# Patient Record
Sex: Male | Born: 1980 | Race: White | Hispanic: No | Marital: Married | Smoking: Never smoker
Health system: Southern US, Community
[De-identification: ages and names within clinical notes are randomized; demographics above are authoritative.]

## PROBLEM LIST (undated history)

## (undated) DIAGNOSIS — J45909 Unspecified asthma, uncomplicated: Secondary | ICD-10-CM

## (undated) HISTORY — PX: CIRCUMCISION: SUR203

---

## 2017-10-28 ENCOUNTER — Other Ambulatory Visit: Payer: Self-pay | Admitting: Family Medicine

## 2017-10-28 DIAGNOSIS — G4484 Primary exertional headache: Secondary | ICD-10-CM

## 2017-11-14 ENCOUNTER — Ambulatory Visit
Admission: RE | Admit: 2017-11-14 | Discharge: 2017-11-14 | Disposition: A | Discharge status: Home / Self Care | Payer: Managed Care, Other (non HMO) | Source: Ambulatory Visit | Attending: Family Medicine | Admitting: Family Medicine

## 2017-11-14 DIAGNOSIS — G4484 Primary exertional headache: Secondary | ICD-10-CM

## 2018-06-29 ENCOUNTER — Other Ambulatory Visit (INDEPENDENT_AMBULATORY_CARE_PROVIDER_SITE_OTHER): Payer: Self-pay | Admitting: Otolaryngology

## 2018-06-29 DIAGNOSIS — J329 Chronic sinusitis, unspecified: Secondary | ICD-10-CM

## 2018-07-06 ENCOUNTER — Ambulatory Visit
Admission: RE | Admit: 2018-07-06 | Discharge: 2018-07-06 | Disposition: A | Discharge status: Home / Self Care | Payer: Managed Care, Other (non HMO) | Source: Ambulatory Visit | Attending: Otolaryngology | Admitting: Otolaryngology

## 2018-07-06 DIAGNOSIS — J329 Chronic sinusitis, unspecified: Secondary | ICD-10-CM

## 2018-07-24 ENCOUNTER — Other Ambulatory Visit: Payer: Self-pay | Admitting: Otolaryngology

## 2018-08-21 ENCOUNTER — Other Ambulatory Visit: Payer: Self-pay

## 2018-08-21 ENCOUNTER — Encounter (HOSPITAL_BASED_OUTPATIENT_CLINIC_OR_DEPARTMENT_OTHER): Payer: Self-pay

## 2018-08-29 ENCOUNTER — Ambulatory Visit (HOSPITAL_BASED_OUTPATIENT_CLINIC_OR_DEPARTMENT_OTHER)
Admission: RE | Admit: 2018-08-29 | Discharge: 2018-08-29 | Disposition: A | Discharge status: Home / Self Care | Payer: Managed Care, Other (non HMO) | Source: Ambulatory Visit | Attending: Otolaryngology | Admitting: Otolaryngology

## 2018-08-29 ENCOUNTER — Encounter (HOSPITAL_BASED_OUTPATIENT_CLINIC_OR_DEPARTMENT_OTHER): Payer: Self-pay

## 2018-08-29 ENCOUNTER — Ambulatory Visit (HOSPITAL_BASED_OUTPATIENT_CLINIC_OR_DEPARTMENT_OTHER): Payer: Managed Care, Other (non HMO) | Admitting: Anesthesiology

## 2018-08-29 ENCOUNTER — Other Ambulatory Visit: Payer: Self-pay

## 2018-08-29 ENCOUNTER — Encounter (HOSPITAL_BASED_OUTPATIENT_CLINIC_OR_DEPARTMENT_OTHER)
Admission: RE | Disposition: A | Discharge status: Home / Self Care | Payer: Self-pay | Source: Ambulatory Visit | Attending: Otolaryngology

## 2018-08-29 DIAGNOSIS — J320 Chronic maxillary sinusitis: Secondary | ICD-10-CM | POA: Insufficient documentation

## 2018-08-29 DIAGNOSIS — J322 Chronic ethmoidal sinusitis: Secondary | ICD-10-CM

## 2018-08-29 DIAGNOSIS — J338 Other polyp of sinus: Secondary | ICD-10-CM

## 2018-08-29 DIAGNOSIS — J45909 Unspecified asthma, uncomplicated: Secondary | ICD-10-CM | POA: Diagnosis not present

## 2018-08-29 DIAGNOSIS — J323 Chronic sphenoidal sinusitis: Secondary | ICD-10-CM

## 2018-08-29 HISTORY — DX: Unspecified asthma, uncomplicated: J45.909

## 2018-08-29 HISTORY — PX: SINUS ENDO W/FUSION: SHX777

## 2018-08-29 HISTORY — PX: ETHMOIDECTOMY: SHX5197

## 2018-08-29 HISTORY — PX: MAXILLARY ANTROSTOMY: SHX2003

## 2018-08-29 SURGERY — SINUS SURGERY, ENDOSCOPIC, USING COMPUTER-ASSISTED NAVIGATION
Anesthesia: General | Site: Nose | Laterality: Bilateral

## 2018-08-29 MED ORDER — MIDAZOLAM HCL 2 MG/2ML IJ SOLN
INTRAMUSCULAR | Status: AC
  Filled 2018-08-29: qty 2

## 2018-08-29 MED ORDER — MIDAZOLAM HCL 2 MG/2ML IJ SOLN
1.0000 mg | INTRAMUSCULAR | Status: DC | PRN
  Administered 2018-08-29: 1 mg via INTRAVENOUS

## 2018-08-29 MED ORDER — PHENYLEPHRINE HCL 10 MG/ML IJ SOLN
INTRAMUSCULAR | Status: DC | PRN
  Administered 2018-08-29: 80 ug via INTRAVENOUS
  Administered 2018-08-29: 120 ug via INTRAVENOUS

## 2018-08-29 MED ORDER — FENTANYL CITRATE (PF) 100 MCG/2ML IJ SOLN
50.0000 ug | INTRAMUSCULAR | Status: DC | PRN
  Administered 2018-08-29 (×2): 50 ug via INTRAVENOUS

## 2018-08-29 MED ORDER — SUGAMMADEX SODIUM 200 MG/2ML IV SOLN
INTRAVENOUS | Status: DC | PRN
  Administered 2018-08-29: 200 mg via INTRAVENOUS

## 2018-08-29 MED ORDER — ROCURONIUM BROMIDE 100 MG/10ML IV SOLN
INTRAVENOUS | Status: DC | PRN
  Administered 2018-08-29: 50 mg via INTRAVENOUS

## 2018-08-29 MED ORDER — LACTATED RINGERS IV SOLN
INTRAVENOUS | Status: DC
  Administered 2018-08-29 (×2): via INTRAVENOUS

## 2018-08-29 MED ORDER — DEXAMETHASONE SODIUM PHOSPHATE 4 MG/ML IJ SOLN
INTRAMUSCULAR | Status: DC | PRN
  Administered 2018-08-29: 10 mg via INTRAVENOUS

## 2018-08-29 MED ORDER — LIDOCAINE 2% (20 MG/ML) 5 ML SYRINGE
INTRAMUSCULAR | Status: AC
  Filled 2018-08-29: qty 5

## 2018-08-29 MED ORDER — CEFAZOLIN SODIUM-DEXTROSE 2-3 GM-%(50ML) IV SOLR
INTRAVENOUS | Status: DC | PRN
  Administered 2018-08-29: 2 g via INTRAVENOUS

## 2018-08-29 MED ORDER — ONDANSETRON HCL 4 MG/2ML IJ SOLN
INTRAMUSCULAR | Status: AC
  Filled 2018-08-29: qty 2

## 2018-08-29 MED ORDER — SUGAMMADEX SODIUM 200 MG/2ML IV SOLN
INTRAVENOUS | Status: AC
  Filled 2018-08-29: qty 2

## 2018-08-29 MED ORDER — FENTANYL CITRATE (PF) 100 MCG/2ML IJ SOLN: 25.0000 ug | INTRAMUSCULAR | Status: DC | PRN

## 2018-08-29 MED ORDER — PROPOFOL 10 MG/ML IV BOLUS
INTRAVENOUS | Status: DC | PRN
  Administered 2018-08-29: 200 mg via INTRAVENOUS

## 2018-08-29 MED ORDER — CLINDAMYCIN HCL 300 MG PO CAPS: 300.0000 mg | ORAL_CAPSULE | Freq: Three times a day (TID) | ORAL | 0 refills | Status: AC

## 2018-08-29 MED ORDER — OXYCODONE HCL 5 MG/5ML PO SOLN: 5.0000 mg | Freq: Once | ORAL | Status: AC | PRN

## 2018-08-29 MED ORDER — DEXAMETHASONE SODIUM PHOSPHATE 10 MG/ML IJ SOLN
INTRAMUSCULAR | Status: AC
  Filled 2018-08-29: qty 1

## 2018-08-29 MED ORDER — ROCURONIUM BROMIDE 50 MG/5ML IV SOSY
PREFILLED_SYRINGE | INTRAVENOUS | Status: AC
  Filled 2018-08-29: qty 5

## 2018-08-29 MED ORDER — PROPOFOL 10 MG/ML IV BOLUS
INTRAVENOUS | Status: AC
  Filled 2018-08-29: qty 20

## 2018-08-29 MED ORDER — OXYCODONE HCL 5 MG PO TABS
5.0000 mg | ORAL_TABLET | Freq: Once | ORAL | Status: AC | PRN
  Administered 2018-08-29: 5 mg via ORAL

## 2018-08-29 MED ORDER — OXYMETAZOLINE HCL 0.05 % NA SOLN
NASAL | Status: DC | PRN
  Administered 2018-08-29: 1 via TOPICAL

## 2018-08-29 MED ORDER — AMOXICILLIN 875 MG PO TABS: 875.0000 mg | ORAL_TABLET | Freq: Two times a day (BID) | ORAL | 0 refills | Status: DC

## 2018-08-29 MED ORDER — OXYCODONE HCL 5 MG PO TABS
ORAL_TABLET | ORAL | Status: AC
  Filled 2018-08-29: qty 1

## 2018-08-29 MED ORDER — SCOPOLAMINE 1 MG/3DAYS TD PT72: 1.0000 | MEDICATED_PATCH | Freq: Once | TRANSDERMAL | Status: DC | PRN

## 2018-08-29 MED ORDER — FENTANYL CITRATE (PF) 100 MCG/2ML IJ SOLN
INTRAMUSCULAR | Status: AC
  Filled 2018-08-29: qty 2

## 2018-08-29 MED ORDER — PROMETHAZINE HCL 25 MG/ML IJ SOLN: 6.2500 mg | INTRAMUSCULAR | Status: DC | PRN

## 2018-08-29 MED ORDER — OXYCODONE-ACETAMINOPHEN 5-325 MG PO TABS: 1.0000 | ORAL_TABLET | ORAL | 0 refills | Status: AC | PRN

## 2018-08-29 MED ORDER — LIDOCAINE HCL (CARDIAC) PF 100 MG/5ML IV SOSY
PREFILLED_SYRINGE | INTRAVENOUS | Status: DC | PRN
  Administered 2018-08-29: 100 mg via INTRAVENOUS

## 2018-08-29 MED ORDER — ONDANSETRON HCL 4 MG/2ML IJ SOLN
INTRAMUSCULAR | Status: DC | PRN
  Administered 2018-08-29: 4 mg via INTRAVENOUS

## 2018-08-29 SURGICAL SUPPLY — 65 items
BLADE RAD40 ROTATE 4M 4 5PK (BLADE) IMPLANT
BLADE RAD40 ROTATE 4M 4MM 5PK (BLADE)
BLADE RAD60 ROTATE M4 4 5PK (BLADE) IMPLANT
BLADE RAD60 ROTATE M4 4MM 5PK (BLADE)
BLADE ROTATE RAD 12 4 M4 (BLADE) IMPLANT
BLADE ROTATE RAD 12 4MM M4 (BLADE)
BLADE ROTATE RAD 40 4 M4 (BLADE) IMPLANT
BLADE ROTATE RAD 40 4MM M4 (BLADE)
BLADE ROTATE TRICUT 4MX13CM M4 (BLADE) ×1
BLADE ROTATE TRICUT 4X13 M4 (BLADE) ×2 IMPLANT
BLADE TRICUT ROTATE M4 4 5PK (BLADE) IMPLANT
BLADE TRICUT ROTATE M4 4MM 5PK (BLADE)
BUR HS RAD FRONTAL 3 (BURR) IMPLANT
BUR HS RAD FRONTAL 3MM (BURR)
CANISTER SUC SOCK COL 7IN (MISCELLANEOUS) ×6 IMPLANT
CANISTER SUCT 1200ML W/VALVE (MISCELLANEOUS) ×3 IMPLANT
COAGULATOR SUCT 6 FR SWTCH (ELECTROSURGICAL)
COAGULATOR SUCT 8FR VV (MISCELLANEOUS) ×3 IMPLANT
COAGULATOR SUCT SWTCH 10FR 6 (ELECTROSURGICAL) IMPLANT
COVER WAND RF STERILE (DRAPES) IMPLANT
DECANTER SPIKE VIAL GLASS SM (MISCELLANEOUS) IMPLANT
DRSG NASAL KENNEDY LMNT 8CM (GAUZE/BANDAGES/DRESSINGS) IMPLANT
DRSG NASOPORE 8CM (GAUZE/BANDAGES/DRESSINGS) IMPLANT
DRSG TELFA 3X8 NADH (GAUZE/BANDAGES/DRESSINGS) IMPLANT
ELECT REM PT RETURN 9FT ADLT (ELECTROSURGICAL) ×3
ELECTRODE REM PT RTRN 9FT ADLT (ELECTROSURGICAL) ×1 IMPLANT
GLOVE BIO SURGEON STRL SZ 6.5 (GLOVE) ×6 IMPLANT
GLOVE BIO SURGEON STRL SZ7.5 (GLOVE) ×3 IMPLANT
GLOVE BIO SURGEONS STRL SZ 6.5 (GLOVE) ×3
GLOVE BIOGEL PI IND STRL 7.0 (GLOVE) ×3 IMPLANT
GLOVE BIOGEL PI INDICATOR 7.0 (GLOVE) ×6
GOWN STRL REUS W/ TWL LRG LVL3 (GOWN DISPOSABLE) ×4 IMPLANT
GOWN STRL REUS W/TWL LRG LVL3 (GOWN DISPOSABLE) ×8
HEMOSTAT SURGICEL 2X14 (HEMOSTASIS) IMPLANT
IV NS 1000ML (IV SOLUTION)
IV NS 1000ML BAXH (IV SOLUTION) IMPLANT
IV NS 500ML (IV SOLUTION) ×2
IV NS 500ML BAXH (IV SOLUTION) ×1 IMPLANT
NEEDLE HYPO 25X1 1.5 SAFETY (NEEDLE) ×3 IMPLANT
NEEDLE PRECISIONGLIDE 27X1.5 (NEEDLE) IMPLANT
NEEDLE SPNL 25GX3.5 QUINCKE BL (NEEDLE) IMPLANT
NS IRRIG 1000ML POUR BTL (IV SOLUTION) ×3 IMPLANT
PACK BASIN DAY SURGERY FS (CUSTOM PROCEDURE TRAY) ×3 IMPLANT
PACK ENT DAY SURGERY (CUSTOM PROCEDURE TRAY) ×3 IMPLANT
PACKING NASAL EPIS 4X2.4 XEROG (MISCELLANEOUS) IMPLANT
SLEEVE SCD COMPRESS KNEE MED (MISCELLANEOUS) ×3 IMPLANT
SOLUTION BUTLER CLEAR DIP (MISCELLANEOUS) ×3 IMPLANT
SPLINT NASAL AIRWAY SILICONE (MISCELLANEOUS) IMPLANT
SPONGE GAUZE 2X2 8PLY STER LF (GAUZE/BANDAGES/DRESSINGS) ×1
SPONGE GAUZE 2X2 8PLY STRL LF (GAUZE/BANDAGES/DRESSINGS) ×2 IMPLANT
SPONGE NEURO XRAY DETECT 1X3 (DISPOSABLE) ×3 IMPLANT
SUCTION FRAZIER HANDLE 10FR (MISCELLANEOUS) ×2
SUCTION TUBE FRAZIER 10FR DISP (MISCELLANEOUS) ×1 IMPLANT
SUT ETHILON 3 0 PS 1 (SUTURE) IMPLANT
SUT PLAIN 4 0 ~~LOC~~ 1 (SUTURE) IMPLANT
SYR 3ML 23GX1 SAFETY (SYRINGE) IMPLANT
SYR 50ML LL SCALE MARK (SYRINGE) ×3 IMPLANT
TOWEL GREEN STERILE FF (TOWEL DISPOSABLE) ×3 IMPLANT
TRACKER ENT INSTRUMENT (MISCELLANEOUS) ×3 IMPLANT
TRACKER ENT PATIENT (MISCELLANEOUS) ×3 IMPLANT
TUBE CONNECTING 20'X1/4 (TUBING) ×1
TUBE CONNECTING 20X1/4 (TUBING) ×2 IMPLANT
TUBE SALEM SUMP 16 FR W/ARV (TUBING) ×3 IMPLANT
TUBING STRAIGHTSHOT EPS 5PK (TUBING) ×3 IMPLANT
YANKAUER SUCT BULB TIP NO VENT (SUCTIONS) ×3 IMPLANT

## 2018-08-29 NOTE — H&P (Signed)
Cc: Chronic rhinosinusitis, headache  HPI: The patient is a 37 year old male who returns today for his follow-up evaluation. The patient was last seen 4 weeks ago.  At that time, he was noted to have bilateral chronic maxillary sinusitis, with possible fungal infection in the right maxillary sinus.  He was also complaining of frequent recurrent headaches. He subsequently underwent a sinus CT scan. The CT scan showed hyperdense material within the right maxillary sinus, concerning for fungal sinusitis.  He also has mucosal thickening in the left maxillary sinus, and ethmoid and sphenoid sinuses.  The mucosal thickening is worse within the right sphenoid and left ethmoid sinuses.  The patient returns today again complaining of frequent recurrent headaches.  His headache is often exacerbated with exercise.  Currently he denies any fever or visual change. No other ENT, GI, or respiratory issue noted since the last visit.   Exam: General: Communicates without difficulty, well nourished, no acute distress. Head: Normocephalic, no evidence injury, no tenderness, facial buttresses intact without stepoff. Face/sinus: No tenderness to palpation and percussion. Facial movement is normal and symmetric. Eyes: PERRL, EOMI. No scleral icterus, conjunctivae clear. Neuro: CN II exam reveals vision grossly intact.  No nystagmus at any point of gaze. Ears: Auricles well formed without lesions.  Ear canals are intact without mass or lesion.  No erythema or edema is appreciated.  The TMs are intact without fluid. Nose: External evaluation reveals normal support and skin without lesions.  Dorsum is intact.  Anterior rhinoscopy reveals congested mucosa over anterior aspect of inferior turbinates and deviated septum. Oral:  Oral cavity and oropharynx are intact, symmetric, without erythema or edema.  Mucosa is moist without lesions. Neck: Full range of motion without pain.  There is no significant lymphadenopathy.  No masses  palpable.  Thyroid bed within normal limits to palpation.  Parotid glands and submandibular glands equal bilaterally without mass.  Trachea is midline. Neuro:  CN 2-12 grossly intact. Gait normal.   Assessment 1.  Bilateral chronic rhinosinusitis, with possible fungal infection within the right maxillary sinus. Both ostiomeatal complexes are narrowed by his mucosal edema.  Mucosal thickening in the ethmoid and sphenoid sinuses.   2.  Frequent recurrent headaches.   Plan  1.  The physical exam findings and the CT images are extensively reviewed with the patient.  2. The treatment options are also extensively discussed.  Based on the above findings, it is recommended the patient should undergo endoscopic sinus surgery to improve his sinonasal drainage pathways.  Specifically, the hyperdense material within his right maxillary sinus should be removed.  The drainage pathways of his maxillary, ethmoid and sphenoid sinuses should be opened.  The risks, benefits, and details of the procedures are reviewed with the patient.  3.  The patient would like to proceed with the procedures.

## 2018-08-29 NOTE — Transfer of Care (Signed)
Immediate Anesthesia Transfer of Care Note  Patient: Bobby Fisher  Procedure(s) Performed: ENDOSCOPIC SINUS SURGERY WITH FUSION NAVIGATION (Bilateral Nose) BILATERAL MAXILLARY ANTROSTOMY WITH TISSUE REMOVAL (Bilateral Nose) TOTAL ETHMOIDECTOMY AND SPENOIDECTOMY WITH TISSUE REMOVAL (Bilateral Nose)  Patient Location: PACU  Anesthesia Type:General  Level of Consciousness: awake, alert  and oriented  Airway & Oxygen Therapy: Patient Spontanous Breathing and Patient connected to face mask oxygen  Post-op Assessment: Report given to RN and Post -op Vital signs reviewed and stable  Post vital signs: Reviewed and stable  Last Vitals:  Vitals Value Taken Time  BP 124/77 08/29/2018 11:06 AM  Temp    Pulse 84 08/29/2018 11:07 AM  Resp 16 08/29/2018 11:07 AM  SpO2 100 % 08/29/2018 11:07 AM  Vitals shown include unvalidated device data.  Last Pain:  Vitals:   08/29/18 0841  TempSrc: Oral  PainSc: 0-No pain         Complications: No apparent anesthesia complications

## 2018-08-29 NOTE — Op Note (Signed)
DATE OF PROCEDURE: 08/29/2018  OPERATIVE REPORT   SURGEON: Newman PiesSu Kirk Basquez, MD   PREOPERATIVE DIAGNOSES:  1. Bilateral chronic rhinosinusitis and polyposis (maxillary, ethmoid, and sphenoid) 2. Chronic headache.  POSTOPERATIVE DIAGNOSES:  1. Bilateral chronic rhinosinusitis and polyposis (maxillary, ethmoid, and sphenoid) 2. Chronic headache.  PROCEDURE PERFORMED:  1. Bilateral endoscopic total ethmoidectomy and sphenoidotomy with polyp removal. 2. Bilateral endoscopic maxillary antrostomy with polyp removal. 3. FUSION stereotactic image guidance.  ANESTHESIA: General endotracheal tube anesthesia.   COMPLICATIONS: None.   ESTIMATED BLOOD LOSS: 200 mL.   INDICATION FOR PROCEDURE: Bobby Fisher is a 37 y.o. male with a history of chronic headache and rhinosinusitis. His CT scan showed hyperdense material within the right maxillary sinus, concerning for fungal sinusitis.  He also has mucosal thickening in the left maxillary sinus, and ethmoid and sphenoid sinuses.  The mucosal thickening is worse within the right sphenoid and left ethmoid sinuses.  Based on the above findings, the decision was made for the patient to undergo the above-stated procedures. The risks, benefits, alternatives, and details of the procedures were discussed with the patient. Questions were invited and answered. Informed consent was obtained.   DESCRIPTION OF PROCEDURE: The patient was taken to the operating room and placed supine on the operating table. General endotracheal tube anesthesia was administered by the anesthesiologist. The patient was positioned, and prepped and draped in the standard fashion for nasal surgery. Pledgets soaked with Afrin were placed in both nasal cavities for decongestion. The pledgets were subsequently removed.   The FUSION stereotactic image guidance marker was placed. The image guidance system was functional throughout the case. Using a 0 endoscope, the left nasal cavity was  examined. The left middle turbinate was medialized. Polypoid tissue was noted within the middle meatus. The polypoid tissue was removed using a combination of microdebrider and Blakesley forceps. The uncinate process was then removed with a freer elevator. The maxillary antrum was entered and enlarged. Polypoid tissue was removed from the maxillary sinus.  Attention was then focused on the ethmoid sinuses. The bony walls of the anterior and posterior ethmoid cavities were taken down. Polypoid tissue was removed from the ethmoid sinuses. The opening to the left sphenoid sinus was then identified and an enlarged. Polypoid tissue was also removed from the left sphenoid sinus. All 3 sinuses were copiously irrigated with saline solution.  The same procedure was repeated on the right side without exception. No fungal elements was noted within the right maxillary sinus. However, more polypoid tissue was removed on the right side. The specimens were sent to the pathology department for permanent histologic identification.  The care of the patient was turned over to the anesthesiologist. The patient was awakened from anesthesia without difficulty. The patient was extubated and transferred to the recovery room in good condition.   OPERATIVE FINDINGS: Bilateral chronic rhinosinusitis, involving the maxillary, ethmoid, and sphenoid sinuses. No fungal elements or acute infection was noted.  SPECIMEN: Bilateral sinus contents.  FOLLOWUP CARE: The patient be discharged home once he is awake and alert. The patient will be placed on Percocet 1 tablets p.o. q.4 hours p.r.n. pain, and amoxicillin 875 mg p.o. b.i.d. for 5 days. The patient will follow up in my office in approximately 1 week.  Bobby Thalman Philomena DohenyWooi Zeke Aker, MD

## 2018-08-29 NOTE — Discharge Instructions (Addendum)

## 2018-08-29 NOTE — Anesthesia Postprocedure Evaluation (Signed)
Anesthesia Post Note  Patient: [facility] visitorLeelaratne  Procedure(s) Performed: ENDOSCOPIC SINUS SURGERY WITH FUSION NAVIGATION (Bilateral Nose) BILATERAL MAXILLARY ANTROSTOMY WITH TISSUE REMOVAL (Bilateral Nose) TOTAL ETHMOIDECTOMY AND SPENOIDECTOMY WITH TISSUE REMOVAL (Bilateral Nose)     Patient location during evaluation: PACU Anesthesia Type: General Level of consciousness: awake and alert Pain management: pain level controlled Vital Signs Assessment: post-procedure vital signs reviewed and stable Respiratory status: spontaneous breathing, nonlabored ventilation and respiratory function stable Cardiovascular status: blood pressure returned to baseline and stable Postop Assessment: no apparent nausea or vomiting Anesthetic complications: no    Last Vitals:  Vitals:   08/29/18 1130 08/29/18 1133  BP: 126/81   Pulse: 75   Resp: 10   Temp:    SpO2: 100% 100%    Last Pain:  Vitals:   08/29/18 1133  TempSrc:   PainSc: 0-No pain                 Beryle Lathehomas E Brock

## 2018-08-29 NOTE — Anesthesia Preprocedure Evaluation (Addendum)
Anesthesia Evaluation  Patient identified by MRN, date of birth, ID band Patient awake    Reviewed: Allergy & Precautions, NPO status , Patient's Chart, lab work & pertinent test results  History of Anesthesia Complications Negative for: history of anesthetic complications  Airway Mallampati: I  TM Distance: >3 FB Neck ROM: Full    Dental  (+) Chipped, Dental Advisory Given,    Pulmonary asthma ,    breath sounds clear to auscultation       Cardiovascular negative cardio ROS   Rhythm:Regular Rate:Normal     Neuro/Psych negative neurological ROS  negative psych ROS   GI/Hepatic negative GI ROS, Neg liver ROS,   Endo/Other  negative endocrine ROS  Renal/GU negative Renal ROS     Musculoskeletal negative musculoskeletal ROS (+)   Abdominal   Peds  Hematology negative hematology ROS (+)   Anesthesia Other Findings   Reproductive/Obstetrics                            Anesthesia Physical Anesthesia Plan  ASA: III  Anesthesia Plan: General   Post-op Pain Management:    Induction: Intravenous  PONV Risk Score and Plan: 2 and Treatment may vary due to age or medical condition, Ondansetron, Midazolam and Dexamethasone  Airway Management Planned: Oral ETT  Additional Equipment: None  Intra-op Plan:   Post-operative Plan: Extubation in OR  Informed Consent: I have reviewed the patients History and Physical, chart, labs and discussed the procedure including the risks, benefits and alternatives for the proposed anesthesia with the patient or authorized representative who has indicated his/her understanding and acceptance.   Dental advisory given  Plan Discussed with: CRNA and Anesthesiologist  Anesthesia Plan Comments:        Anesthesia Quick Evaluation

## 2018-08-30 ENCOUNTER — Encounter (HOSPITAL_BASED_OUTPATIENT_CLINIC_OR_DEPARTMENT_OTHER): Payer: Self-pay | Admitting: Otolaryngology

## 2020-05-09 IMAGING — CT CT MAXILLOFACIAL W/O CM
1 series · 15 of 30 positions shown, 19 images · non-contrast
Comparison: MR brain 11/14/2017.

ADDENDUM:
Following discussion of the case with, and review of images with
ordering provider, note is made of a subcentimeter irregular
collection hyperdense material in the anterior inferior RIGHT
maxillary sinus which could suggest allergic fungal RIGHT maxillary
sinus disease. This does not appear to represent dental material or
an occult fracture.
CLINICAL DATA: Stealth fusion protocol, headaches.

EXAM:
CT MAXILLOFACIAL WITHOUT CONTRAST
TECHNIQUE: Multidetector CT images of the paranasal sinuses were obtained using
the standard protocol without intravenous contrast.

[Series 4: soft tissue · axial · 0.46mm/px · z∈[-218,-49]mm · 15 of 183 slices shown, 19 images]
[im 7/183  brain]
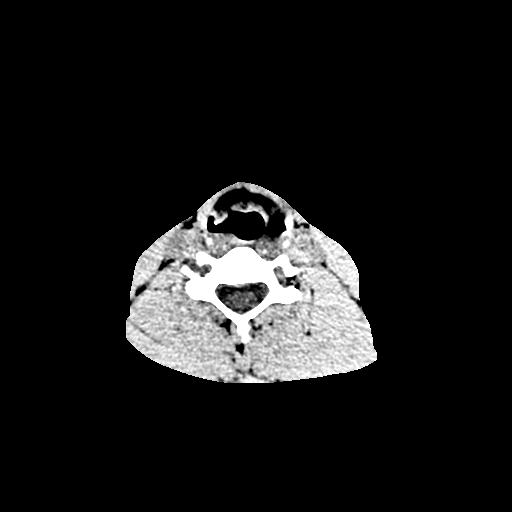
[im 7/183  bone]
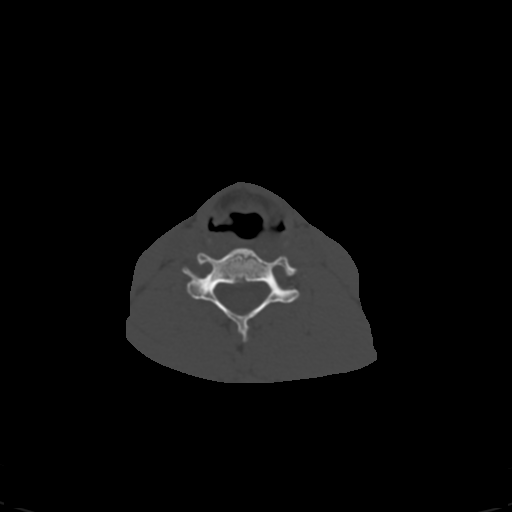
[im 19/183  bone]
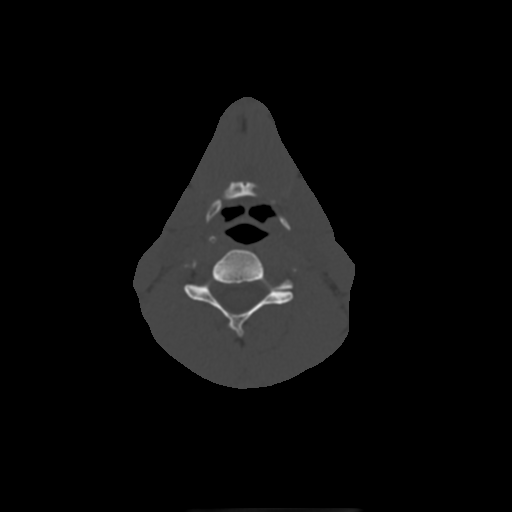
[im 32/183  bone]
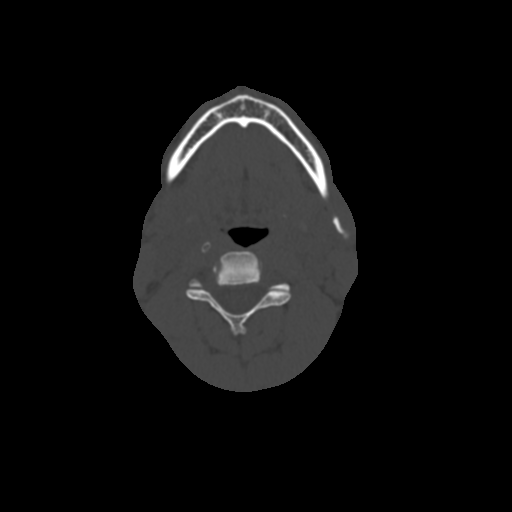
[im 44/183  bone]
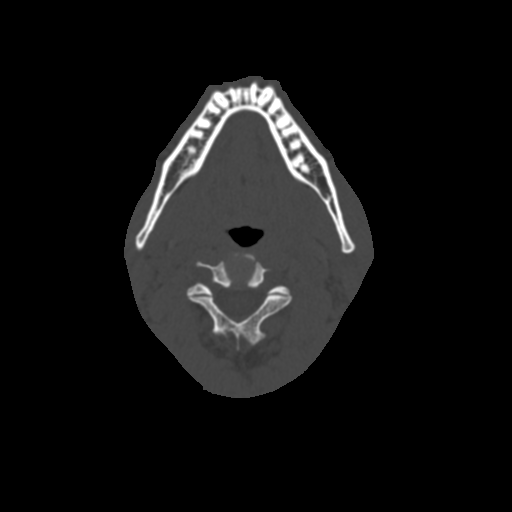
[im 57/183  brain]
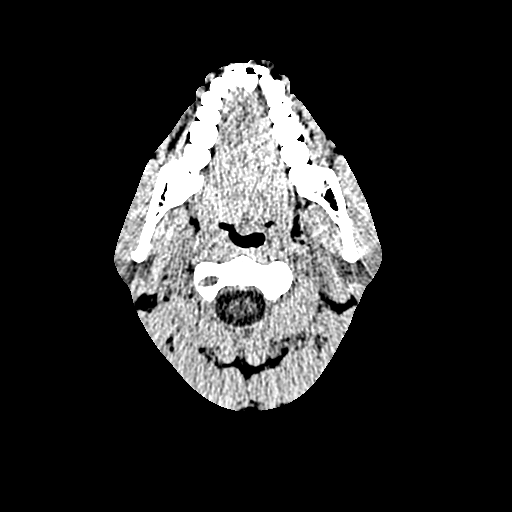
[im 57/183  bone]
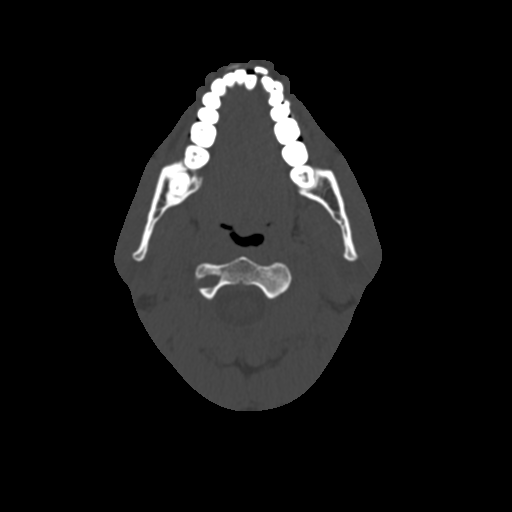
[im 70/183  bone]
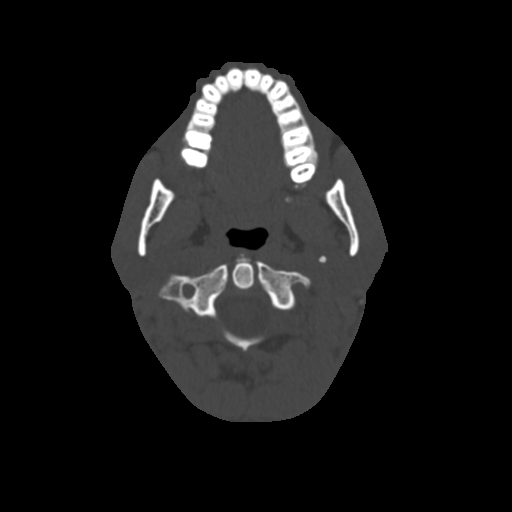
[im 82/183  bone]
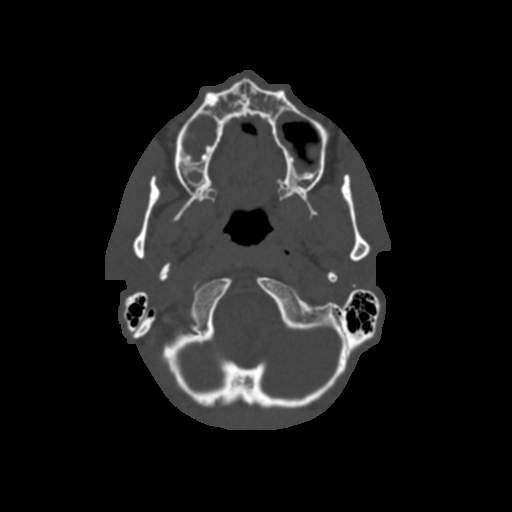
[im 95/183  bone]
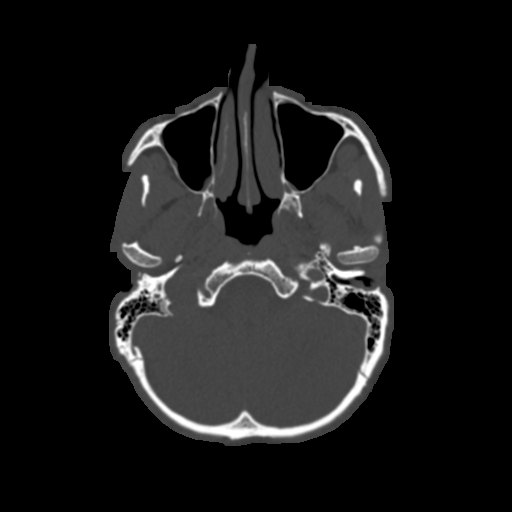
[im 101/183  brain]
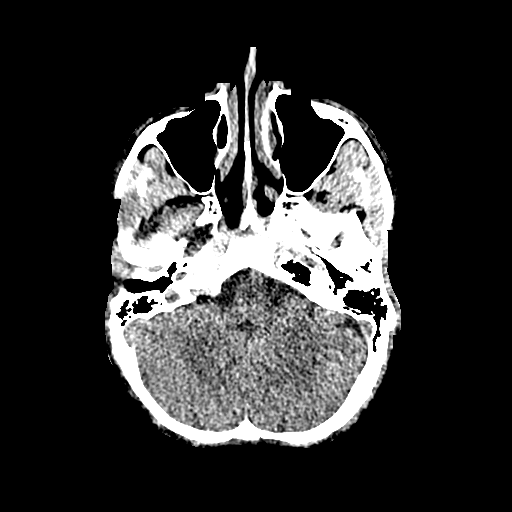
[im 101/183  bone]
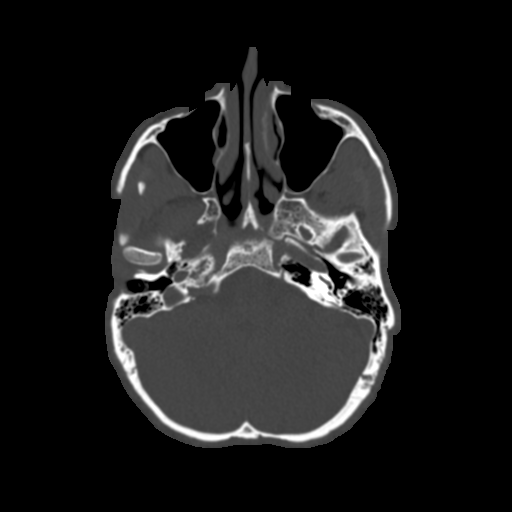
[im 113/183  bone]
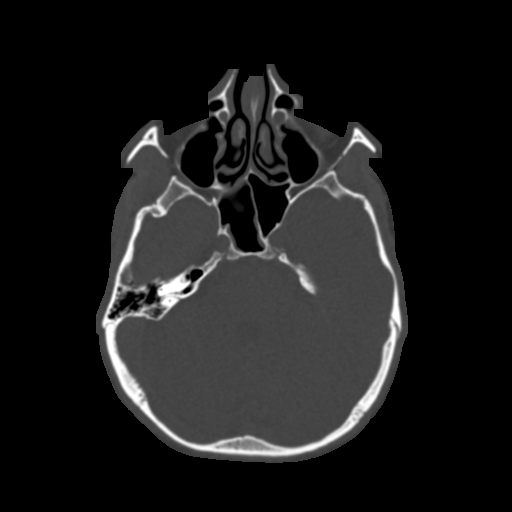
[im 126/183  bone]
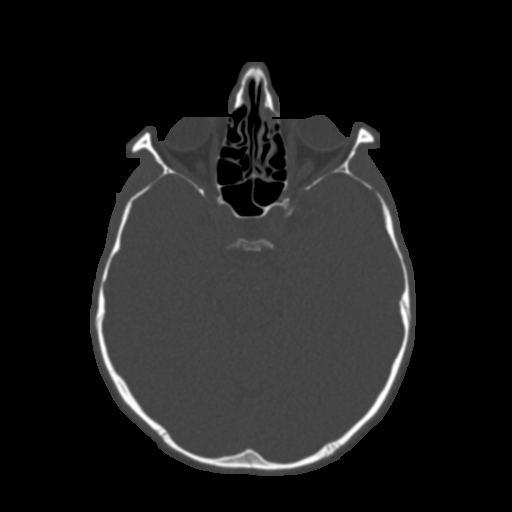
[im 139/183  bone]
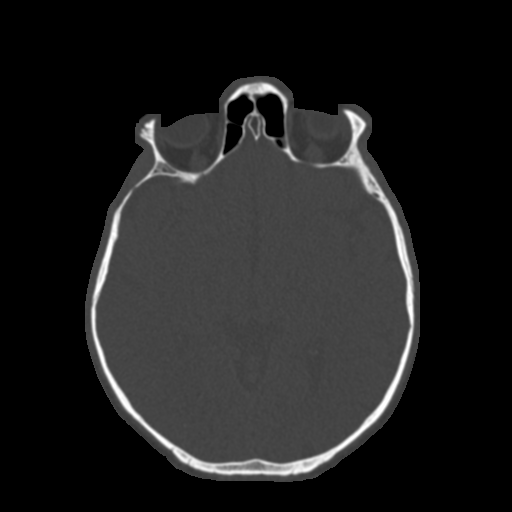
[im 151/183  brain]
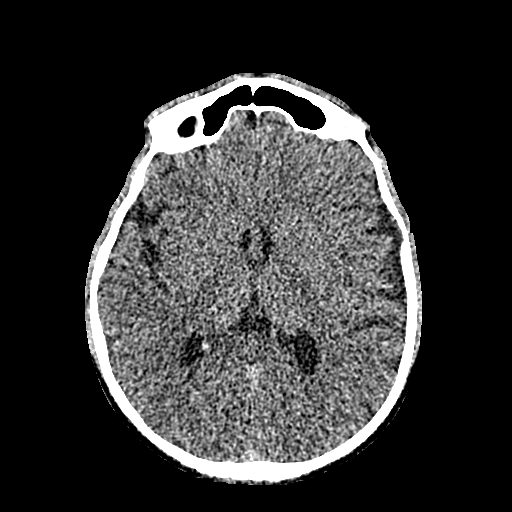
[im 151/183  bone]
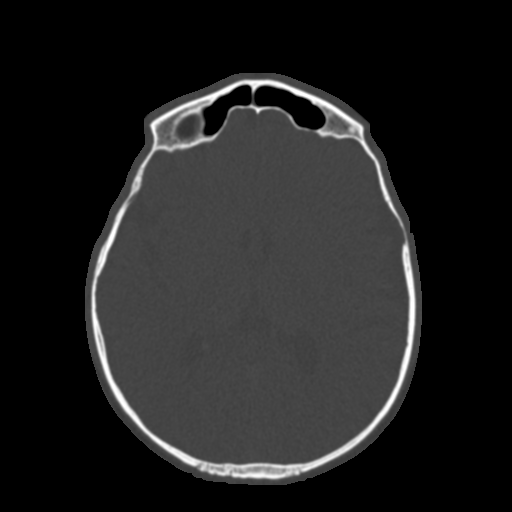
[im 164/183  bone]
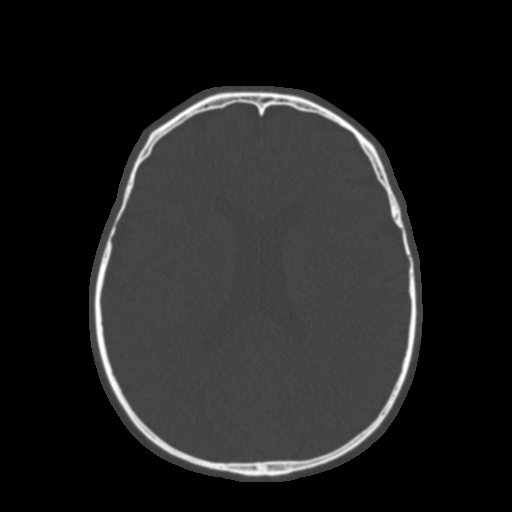
[im 176/183  bone]
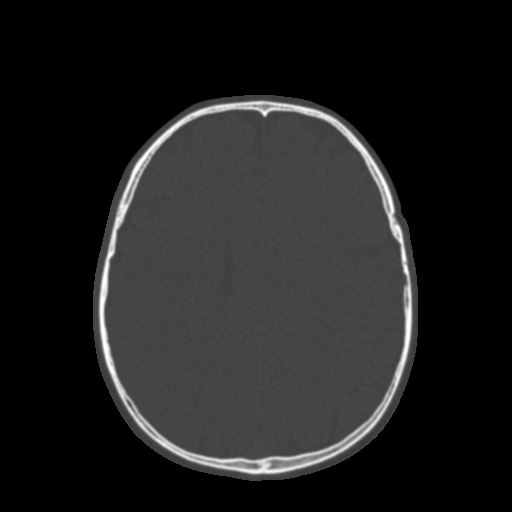

[15 of 30 positions shown; findings below may reference images not displayed]

FINDINGS: Paranasal sinuses:

Frontal: Normally aerated. Patent frontal sinus drainage pathways.

Ethmoid: Mild mucosal thickening, greater on the LEFT.

Maxillary: Mild mucosal thickening, greater on the RIGHT.

Sphenoid: Mild mucosal thickening, greater on the LEFT.

Right ostiomeatal unit: Patent but narrowed, prominent ethmoidal
bullae

Left ostiomeatal unit: Patent but narrowed, prominent ethmoidal
bullae.

Nasal passages: Crowding of the nasal passages, BILATERAL inferior
turbinate hypertrophy, greater on the LEFT. Intact nasal septum is
essentially midline.

Anatomy: Pneumatization superior to the anterior ethmoid notches due
to frontal sinus overgrowth. Symmetric and intact olfactory grooves
and fovea ethmoidalis, Keros II (4-7mm) Sellar sphenoid
pneumatization pattern.

Other: Orbits and intracranial compartment are unremarkable. Visible
mastoid air cells are normally aerated.
IMPRESSION: Stealth protocol for fusion. Chronic sinusitis. Patent but narrowed
ostiomeatal units. See discussion above.

## 2021-03-12 ENCOUNTER — Ambulatory Visit: Payer: Managed Care, Other (non HMO) | Admitting: Allergy

## 2023-05-27 ENCOUNTER — Other Ambulatory Visit: Payer: Self-pay | Admitting: Family Medicine

## 2023-05-27 ENCOUNTER — Ambulatory Visit
Admission: RE | Admit: 2023-05-27 | Discharge: 2023-05-27 | Disposition: A | Discharge status: Home / Self Care | Payer: Managed Care, Other (non HMO) | Source: Ambulatory Visit | Attending: Family Medicine | Admitting: Family Medicine

## 2023-05-27 ENCOUNTER — Other Ambulatory Visit (HOSPITAL_COMMUNITY): Payer: Self-pay

## 2023-05-27 DIAGNOSIS — M79651 Pain in right thigh: Secondary | ICD-10-CM
